# Patient Record
Sex: Male | Born: 1995 | Race: White | Hispanic: No | Marital: Single | Smoking: Never smoker
Health system: Southern US, Community
[De-identification: ages and names within clinical notes are randomized; demographics above are authoritative.]

## PROBLEM LIST (undated history)

## (undated) ENCOUNTER — Emergency Department (HOSPITAL_BASED_OUTPATIENT_CLINIC_OR_DEPARTMENT_OTHER): Admission: EM | Payer: Medicaid Other | Source: Home / Self Care

## (undated) HISTORY — PX: SHOULDER ARTHROSCOPY: SHX128

---
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
## 2014-03-09 IMAGING — CR DG ANKLE COMPLETE 3+V*L*
3 series · 3 of 3 positions shown · non-contrast
Comparison: 10/10/2011

CLINICAL DATA: Fall

LEFT ANKLE COMPLETE - 3+ VIEW

[t ankle joint ap left]
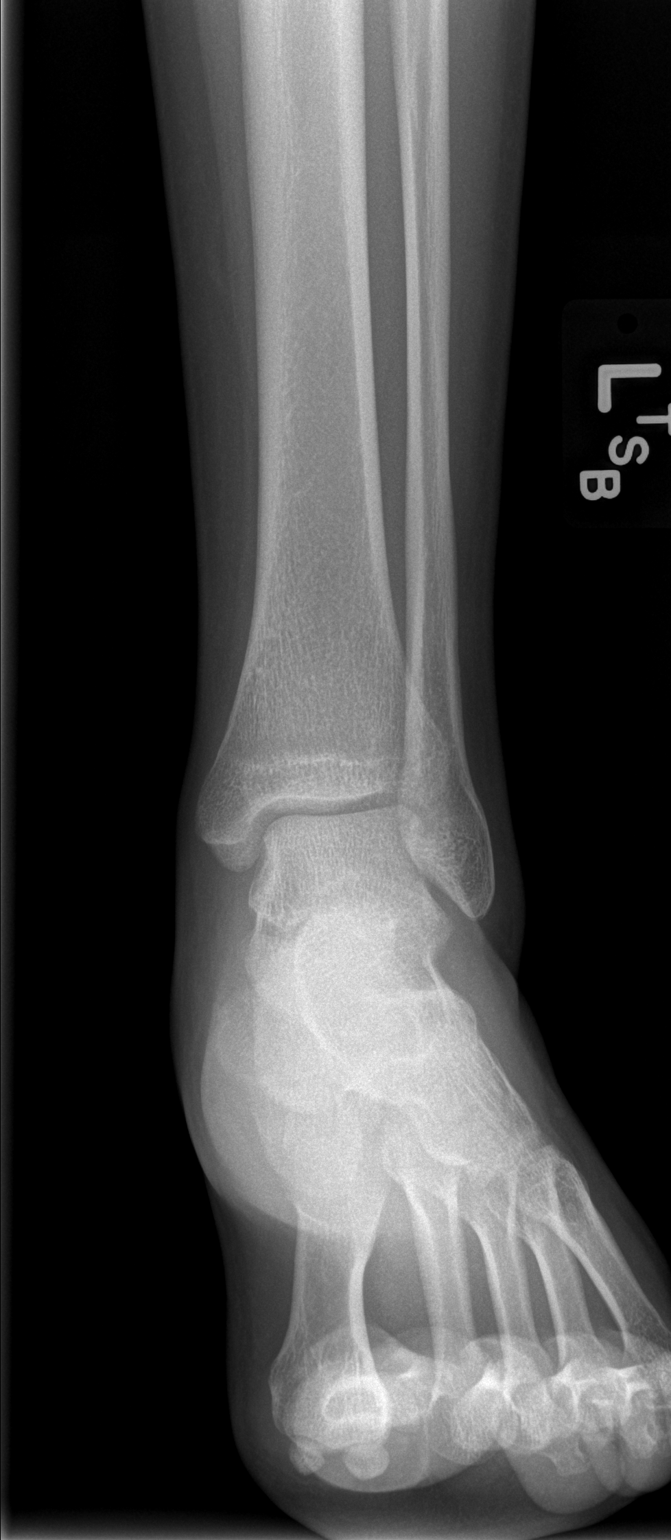

[t ankle joint oblique left]
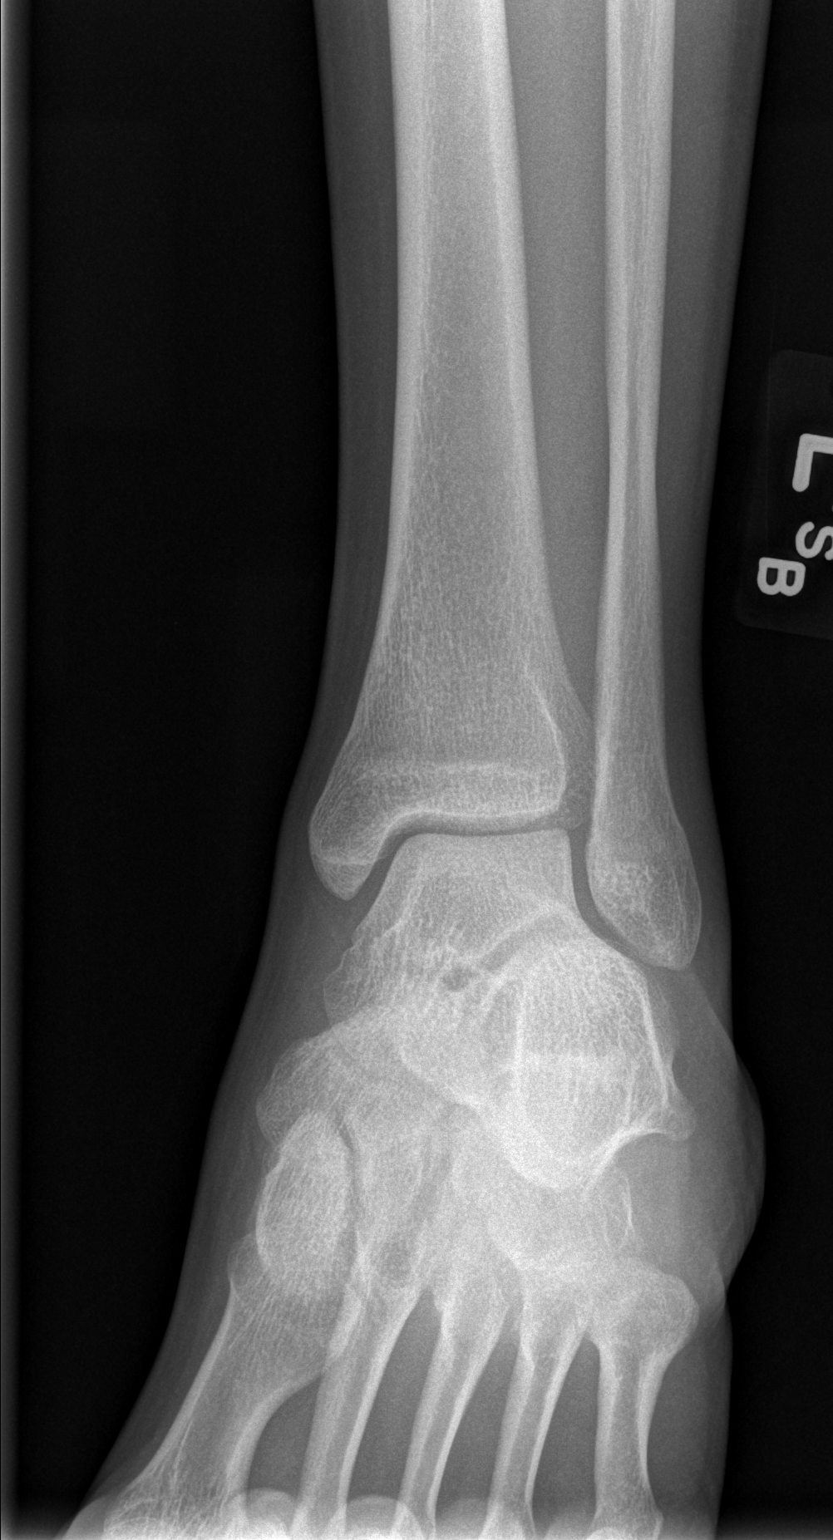

[t ankle joint lat left]
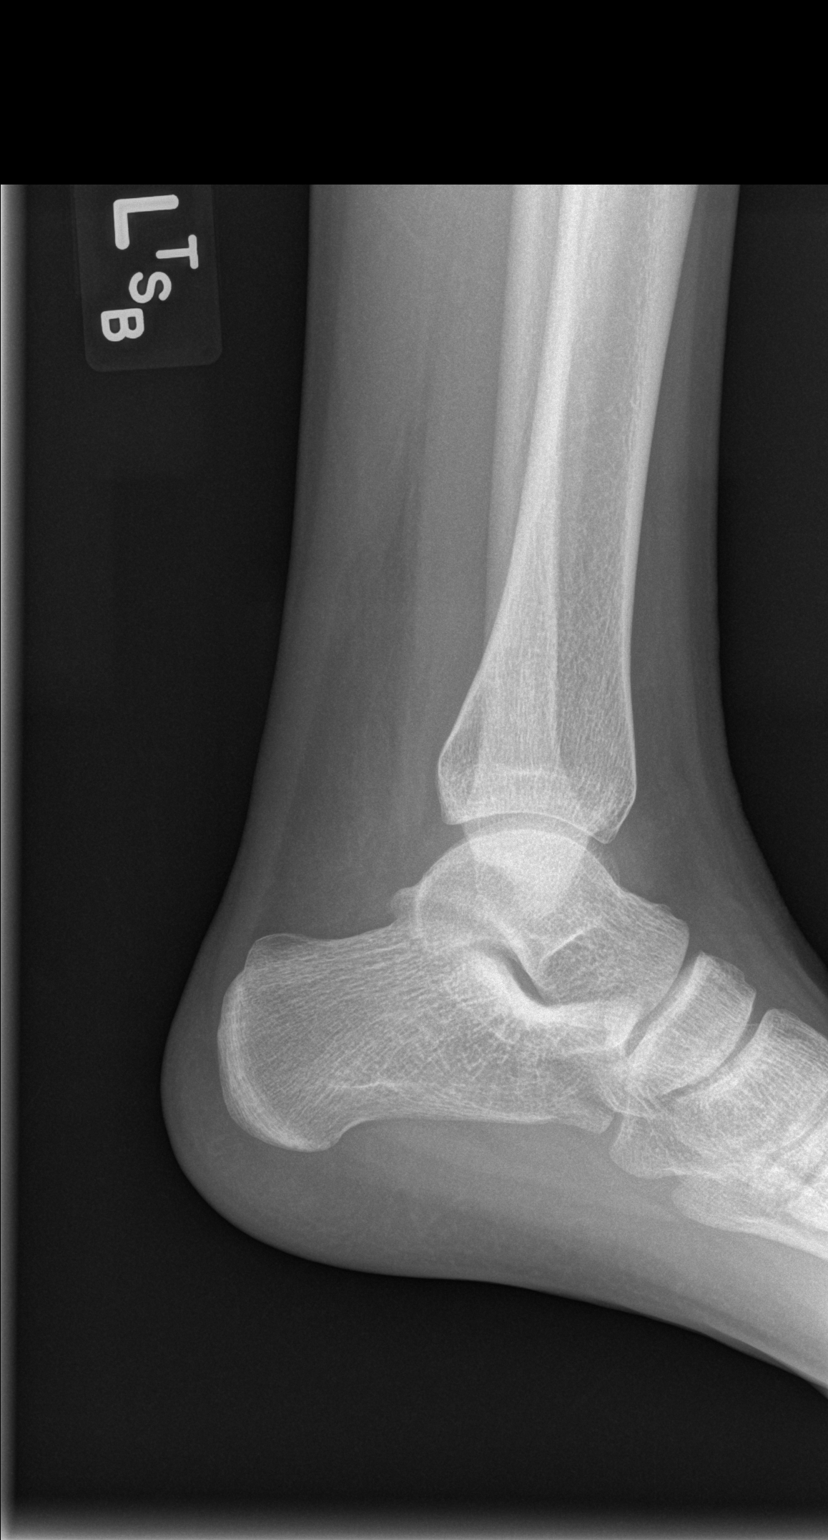

[3 of 3 positions shown; findings below may reference images not displayed]

FINDINGS: No acute fracture and no dislocation.  Unremarkable soft
tissues.  Specifically, no evidence of fibular fracture.
IMPRESSION: No acute bony pathology.

## 2019-02-11 ENCOUNTER — Encounter (HOSPITAL_BASED_OUTPATIENT_CLINIC_OR_DEPARTMENT_OTHER): Payer: Self-pay | Admitting: *Deleted

## 2019-02-11 ENCOUNTER — Emergency Department (HOSPITAL_BASED_OUTPATIENT_CLINIC_OR_DEPARTMENT_OTHER): Payer: PRIVATE HEALTH INSURANCE

## 2019-02-11 ENCOUNTER — Emergency Department (HOSPITAL_BASED_OUTPATIENT_CLINIC_OR_DEPARTMENT_OTHER)
Admission: EM | Admit: 2019-02-11 | Discharge: 2019-02-11 | Disposition: A | Discharge status: Home / Self Care | Payer: PRIVATE HEALTH INSURANCE | Attending: Emergency Medicine | Admitting: Emergency Medicine

## 2019-02-11 ENCOUNTER — Other Ambulatory Visit: Payer: Self-pay

## 2019-02-11 DIAGNOSIS — Y9301 Activity, walking, marching and hiking: Secondary | ICD-10-CM | POA: Diagnosis not present

## 2019-02-11 DIAGNOSIS — S8262XA Displaced fracture of lateral malleolus of left fibula, initial encounter for closed fracture: Secondary | ICD-10-CM | POA: Insufficient documentation

## 2019-02-11 DIAGNOSIS — Y998 Other external cause status: Secondary | ICD-10-CM | POA: Diagnosis not present

## 2019-02-11 DIAGNOSIS — Y9289 Other specified places as the place of occurrence of the external cause: Secondary | ICD-10-CM | POA: Insufficient documentation

## 2019-02-11 DIAGNOSIS — S99912A Unspecified injury of left ankle, initial encounter: Secondary | ICD-10-CM | POA: Diagnosis present

## 2019-02-11 DIAGNOSIS — W010XXA Fall on same level from slipping, tripping and stumbling without subsequent striking against object, initial encounter: Secondary | ICD-10-CM | POA: Diagnosis not present

## 2019-02-11 MED ORDER — OXYCODONE-ACETAMINOPHEN 5-325 MG PO TABS: 1.0000 | ORAL_TABLET | Freq: Four times a day (QID) | ORAL | 0 refills | Status: AC | PRN

## 2019-02-11 MED ORDER — OXYCODONE-ACETAMINOPHEN 5-325 MG PO TABS
1.0000 | ORAL_TABLET | Freq: Once | ORAL | Status: AC
  Administered 2019-02-11: 1 via ORAL
  Filled 2019-02-11: qty 1

## 2019-02-11 NOTE — Discharge Instructions (Addendum)
You were seen in the ED today for left ankle pain Your xray showed a very small fracture to your fibular bone (see attached resource) We have placed you in a cam walker boot and I have prescribed a very short course of narcotic pain medication Please take as needed; I would suggest taking Ibuprofen first and if you are still having pain to take the Percocet then You should rest, ice, and elevate your ankle to reduce the swelling Please follow up with Dr. Raeford Razor sports medicine for further evaluation

## 2019-02-11 NOTE — ED Triage Notes (Signed)
C/o left ankle injury x 1 hr ago

## 2019-02-11 NOTE — ED Provider Notes (Signed)
[facility] EMERGENCY DEPARTMENT Provider Note   CSN: [id number] Arrival date & time: 02/11/19  1742    History   Chief Complaint Chief Complaint  Patient presents with   Ankle Injury    HPI Christian Travis is a 23 y.o. male who presents to the ED today complaining of sudden onset, constant, sharp, 7/10, left ankle pain that began about 1 hr PTA. Pt reports that he was stepping out of his truck when he fell, twisting his ankle in the process. No head injury or LOC. Pt came to the ED immediately. Reports previous left ankle fracture in 2013. He has not taken anything for the pain. Denies any other symptoms.       History reviewed. No pertinent past medical history.  Patient Active Problem List   Diagnosis Date Noted   Left ankle injury 10/11/2011    Past Surgical History:  Procedure Laterality Date   SHOULDER ARTHROSCOPY          Home Medications    Prior to Admission medications   Medication Sig Start Date End Date Taking? Authorizing Provider  ibuprofen (ADVIL,MOTRIN) 200 MG tablet Take 400 mg by mouth every 6 (six) hours as needed. Patient received this medication for a headache.    [provider]  loratadine (CLARITIN) 10 MG tablet Take 10 mg by mouth daily. Patient is using this medication for allergies.    [provider]  oxyCODONE-acetaminophen (PERCOCET/ROXICET) 5-325 MG tablet Take 1 tablet by mouth every 6 (six) hours as needed for severe pain. 02/11/19   Tanda RockersVenter, Teal Bontrager, PA-C    Family History Family History  Problem Relation Age of Onset   Diabetes Mother    Hyperlipidemia Father    Heart attack Neg Hx    Hypertension Neg Hx    Sudden death Neg Hx     Social History Social History   Tobacco Use   Smoking status: Never Smoker   Smokeless tobacco: Never Used  Substance Use Topics   Alcohol use: No   Drug use: No     Allergies   Patient has no known allergies.   Review of Systems Review of Systems   Constitutional: Negative for chills and fever.  Musculoskeletal: Positive for arthralgias and joint swelling.  Skin: Negative for wound.     Physical Exam Updated Vital Signs BP 133/89    Pulse (!) 116    Temp 98.4 F (36.9 C)    Resp 16    Ht 5\' 9"  (1.753 m)    Wt 72.6 kg    SpO2 100%    BMI 23.63 kg/m   Physical Exam Vitals signs and nursing note reviewed.  Constitutional:      Appearance: He is not ill-appearing.  HENT:     Head: Normocephalic and atraumatic.  Eyes:     Conjunctiva/sclera: Conjunctivae normal.  Cardiovascular:     Rate and Rhythm: Normal rate and regular rhythm.  Pulmonary:     Effort: Pulmonary effort is normal.     Breath sounds: Normal breath sounds.  Musculoskeletal:     Comments: Moderate swelling noted to left ankle with tenderness to palpation along lateral malleolus. ROM intact throughout ankle. No tenderness to foot specifically 5th metatarsal or knee. Strength and sensation intact. Good distal pulses.   Skin:    General: Skin is warm and dry.     Coloration: Skin is not jaundiced.  Neurological:     Mental Status: He is alert.      ED  Treatments / Results  Labs (all labs ordered are listed, but only abnormal results are displayed) Labs Reviewed - No data to display  EKG None  Radiology Dg Ankle Complete Left  Result Date: 02/11/2019 CLINICAL DATA:  Fall 1 hour prior EXAM: LEFT ANKLE COMPLETE - 3+ VIEW COMPARISON:  Left ankle radiographs 10/24/2011 FINDINGS: Tiny crescentic avulsion fracture adjacent to the tip of the lateral malleolus with significant overlying soft tissue swelling. The ankle mortise is congruent. No additional fracture or traumatic malalignment is seen. Moderate left ankle joint effusion is present. IMPRESSION: Tiny crescentic avulsion fracture adjacent to the tip of the lateral malleolus with moderate effusion and soft tissue swelling. Electronically Signed   By: Lovena Le M.D.   On: 02/11/2019 18:21     Procedures Procedures (including critical care time)  Medications Ordered in ED Medications  oxyCODONE-acetaminophen (PERCOCET/ROXICET) 5-325 MG per tablet 1 tablet (has no administration in time range)     Initial Impression / Assessment and Plan / ED Course  I have reviewed the triage vital signs and the nursing notes.  Pertinent labs & imaging results that were available during my care of the patient were reviewed by me and considered in my medical decision making (see chart for details).    Pt is a 23 year old male who presents to the ED today complaining of sudden left ankle pain after stepping out of truck earlier today, twisting ankle in the process. He has swelling to ankle and tenderness to lateral malleolus. Xray obtained prior to being seen - avulsion fracture to lateral malleolus. Otherwise unremarkable. Will apply cam walker in the ED today. Do not feel pt needs splint as ankle joint stable. Will have him follow up with sports medicine as well although doubt they will do anything for this surgically. Pt will be given a very short course of pain medication on discharge.   Pt to follow up with sports medicine for further eval.        Final Clinical Impressions(s) / ED Diagnoses   Final diagnoses:  Closed avulsion fracture of lateral malleolus of left fibula, initial encounter    ED Discharge Orders         Ordered    oxyCODONE-acetaminophen (PERCOCET/ROXICET) 5-325 MG tablet  Every 6 hours PRN     02/11/19 1914           Eustaquio Maize, PA-C 02/11/19 2307    Fredia Sorrow, MD 02/14/19 [phone]

## 2021-06-27 IMAGING — DX LEFT ANKLE COMPLETE - 3+ VIEW
3 series · 3 of 3 positions shown · non-contrast
Comparison: Left ankle radiographs 10/24/2011

CLINICAL DATA: Fall 1 hour prior

EXAM:
LEFT ANKLE COMPLETE - 3+ VIEW

[ankle ap]
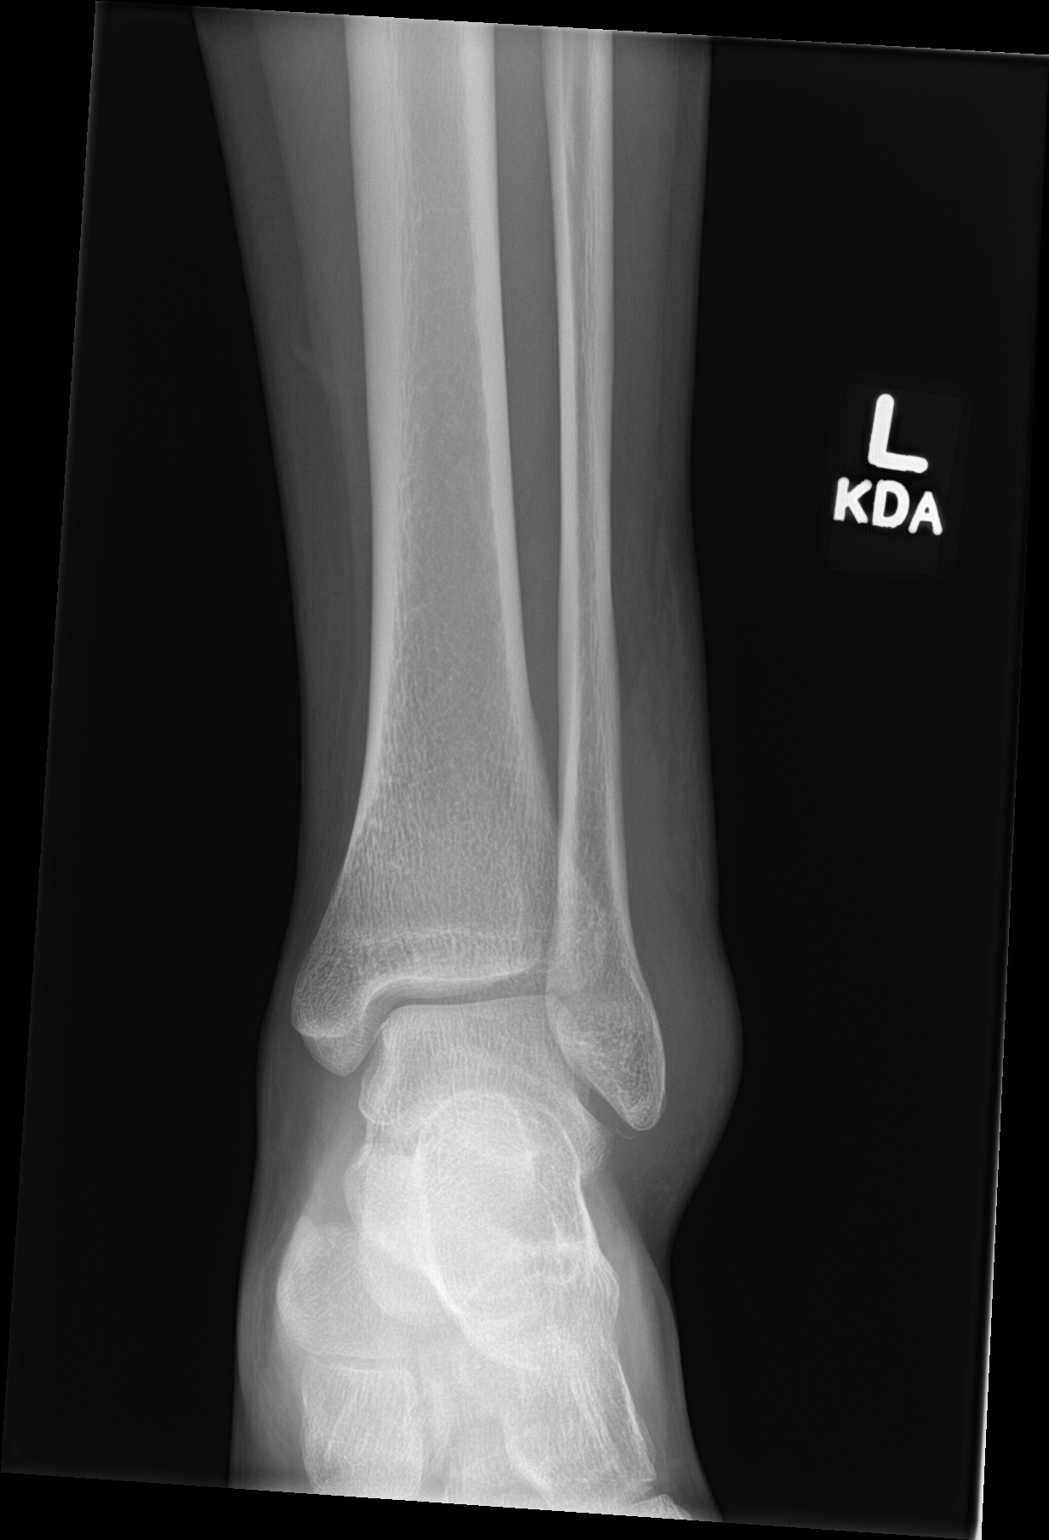

[ankle obl]
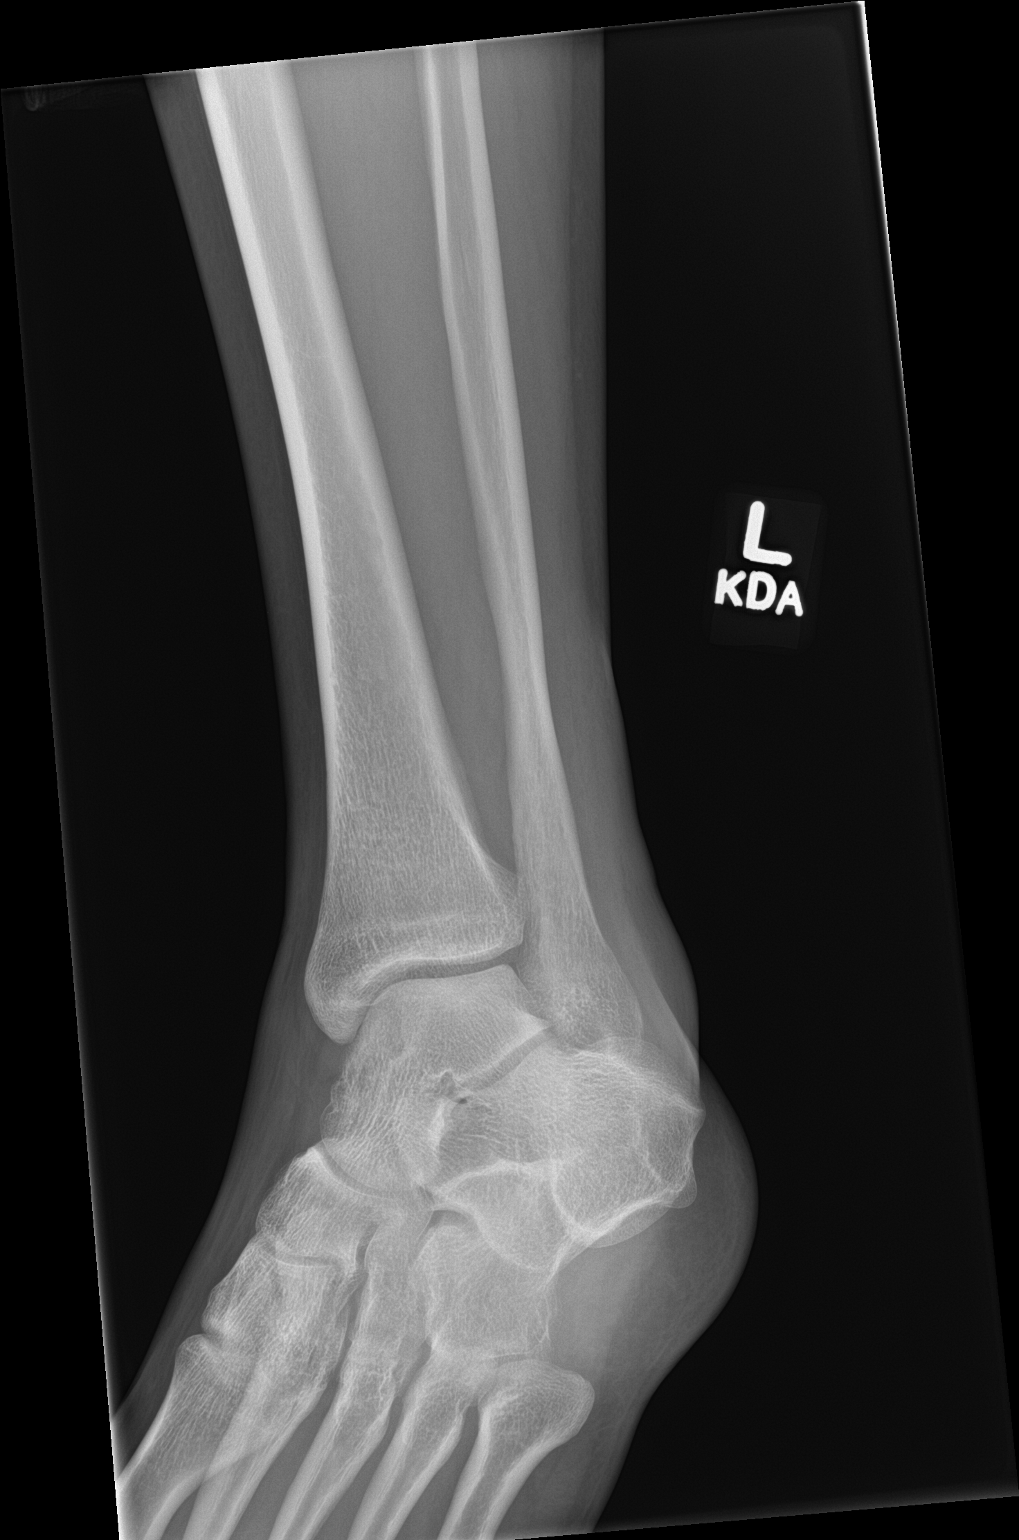

[ankle lat]
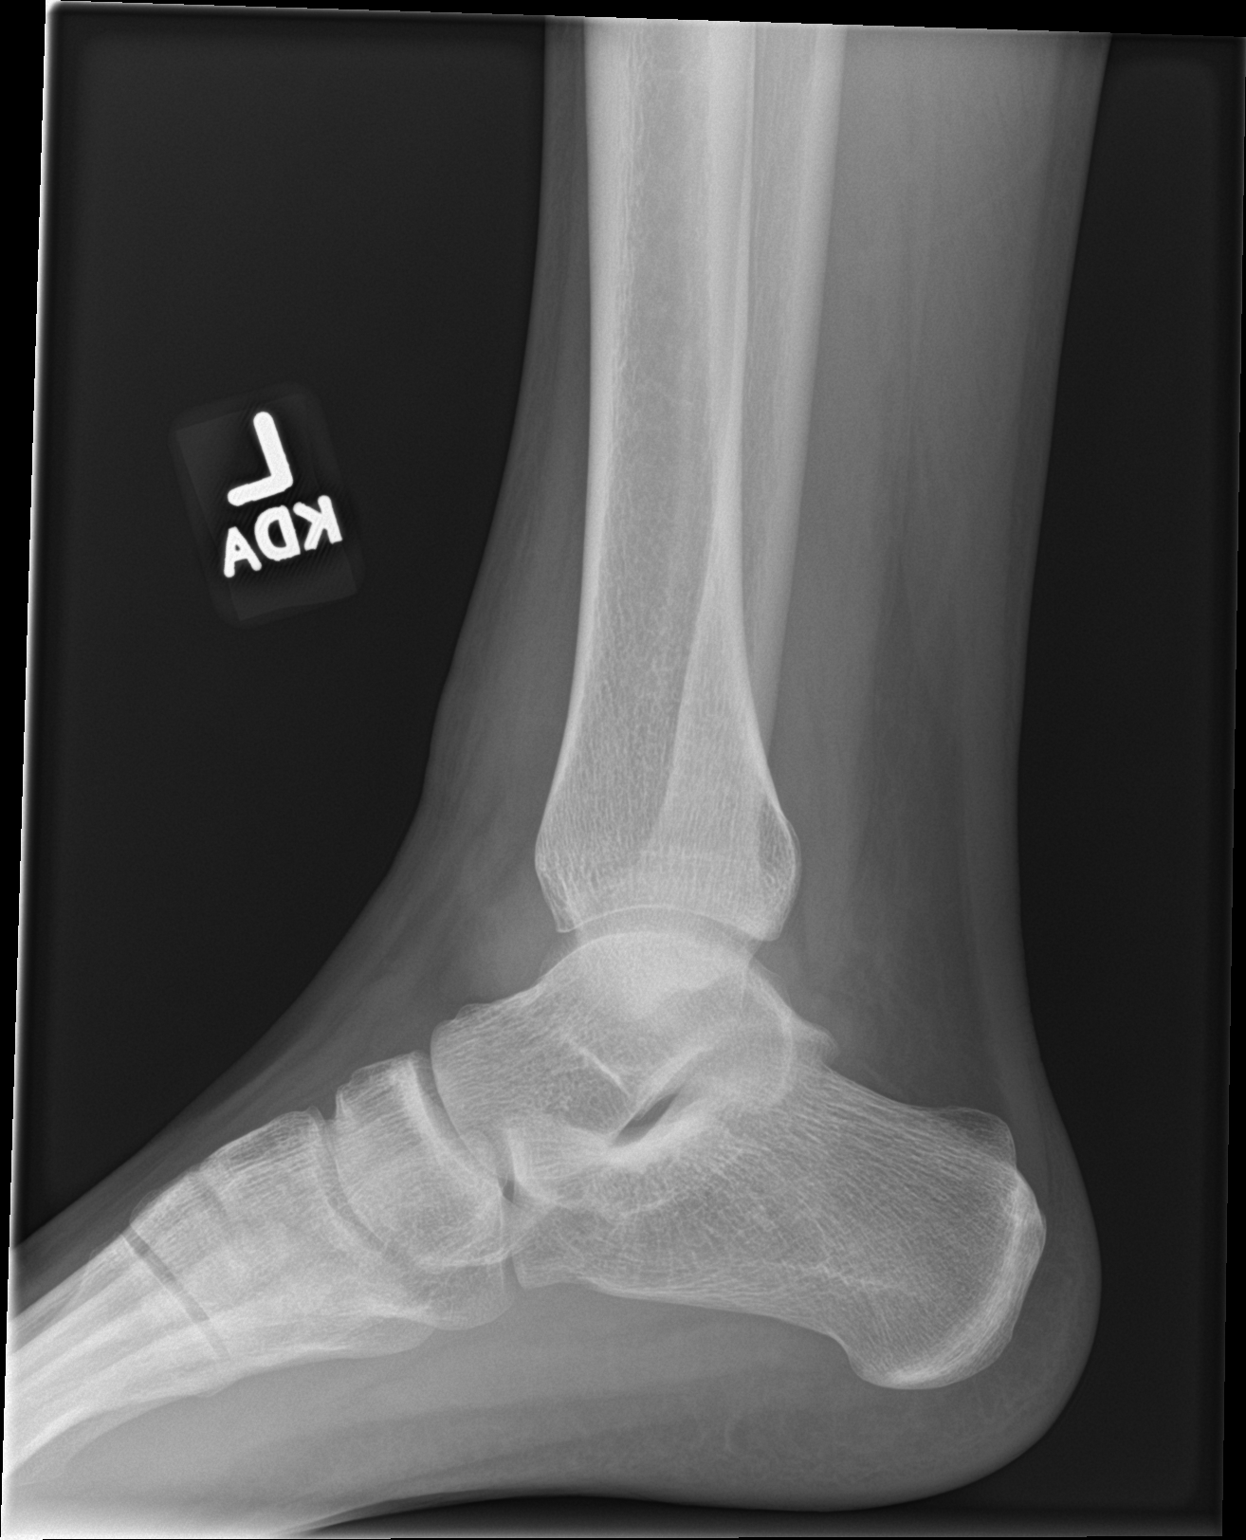

[3 of 3 positions shown; findings below may reference images not displayed]

FINDINGS: Tiny crescentic avulsion fracture adjacent to the tip of the lateral
malleolus with significant overlying soft tissue swelling. The ankle
mortise is congruent. No additional fracture or traumatic
malalignment is seen. Moderate left ankle joint effusion is present.
IMPRESSION: Tiny crescentic avulsion fracture adjacent to the tip of the lateral
malleolus with moderate effusion and soft tissue swelling.
